# Patient Record
Sex: Male | Born: 2000 | Race: White | Hispanic: No | Marital: Single
Health system: Southern US, Community
[De-identification: ages and names within clinical notes are randomized; demographics above are authoritative.]

---
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
## 2018-03-25 ENCOUNTER — Emergency Department (HOSPITAL_COMMUNITY)
Admission: EM | Admit: 2018-03-25 | Discharge: 2018-03-26 | Disposition: A | Discharge status: Home / Self Care | Payer: Medicaid Other | Attending: Emergency Medicine | Admitting: Emergency Medicine

## 2018-03-25 ENCOUNTER — Emergency Department (HOSPITAL_COMMUNITY): Payer: Medicaid Other

## 2018-03-25 ENCOUNTER — Other Ambulatory Visit: Payer: Self-pay

## 2018-03-25 DIAGNOSIS — R110 Nausea: Secondary | ICD-10-CM | POA: Diagnosis not present

## 2018-03-25 DIAGNOSIS — R51 Headache: Secondary | ICD-10-CM | POA: Insufficient documentation

## 2018-03-25 DIAGNOSIS — R519 Headache, unspecified: Secondary | ICD-10-CM

## 2018-03-25 MED ORDER — DIPHENHYDRAMINE HCL 50 MG/ML IJ SOLN
25.0000 mg | Freq: Once | INTRAMUSCULAR | Status: AC
  Administered 2018-03-26: 25 mg via INTRAVENOUS
  Filled 2018-03-25: qty 1

## 2018-03-25 MED ORDER — DEXAMETHASONE SODIUM PHOSPHATE 10 MG/ML IJ SOLN
10.0000 mg | Freq: Once | INTRAMUSCULAR | Status: AC
  Administered 2018-03-26: 10 mg via INTRAVENOUS
  Filled 2018-03-25: qty 1

## 2018-03-25 MED ORDER — METOCLOPRAMIDE HCL 5 MG/ML IJ SOLN
10.0000 mg | Freq: Once | INTRAMUSCULAR | Status: AC
  Administered 2018-03-26: 10 mg via INTRAVENOUS
  Filled 2018-03-25: qty 2

## 2018-03-25 MED ORDER — SODIUM CHLORIDE 0.9 % IV BOLUS
1000.0000 mL | Freq: Once | INTRAVENOUS | Status: AC
  Administered 2018-03-26: 1000 mL via INTRAVENOUS

## 2018-03-25 NOTE — ED Provider Notes (Signed)
[facility] EMERGENCY DEPARTMENT Provider Note   CSN: [id number] Arrival date & time: 03/25/18  2237     History   Chief Complaint Chief Complaint  Patient presents with  . Headache    HPI Jimmy French is a 18 y.o. male.  The history is provided by the patient.  Headache  He has history of obesity, hyperglycemia and comes in complaining of a bifrontal headache for the last 3 days.  Headache is a pressure feeling and seems to be worse with eye movement and worse with standing.  He denies photophobia or phonophobia.  He denies fever, chills, sweats.  There is been no visual change.  He rates his pain at 9/10.  He states that he falls asleep following ibuprofen, but will wake up 45 minutes later with a headache still present.  There has been nausea but no vomiting.  He denies weakness, numbness, tingling.  He has not had headaches like this before.  No past medical history on file.  Patient Active Problem List   Diagnosis Date Noted  . OBESITY 09/11/2009  . VISUAL ACUITY, DECREASED, LEFT EYE 09/11/2009  . HYPERGLYCEMIA 03/14/2007    No past surgical history on file.      Home Medications    Prior to Admission medications   Medication Sig Start Date End Date Taking? Authorizing Provider  Dextromethorphan-Guaifenesin (ROBITUSSIN COLD COUGH+ CHEST PO) Take 30 mLs by mouth 2 (two) times daily. Only today    [provider]  ibuprofen (ADVIL,MOTRIN) 200 MG tablet Take 200 mg by mouth 3 (three) times daily as needed. For pain    [provider]  ibuprofen (ADVIL,MOTRIN) 600 MG tablet Take 1 tablet (600 mg total) by mouth every 6 (six) hours as needed. 02/04/13   Marcellina Millin, MD  ibuprofen (ADVIL,MOTRIN) 800 MG tablet Take 1 tablet (800 mg total) by mouth every 8 (eight) hours as needed. 09/14/16   Sherrilee Gilles, NP    Family History No family history on file.  Social History Social History   Tobacco Use  . Smoking status:  Passive Smoke Exposure - Never Smoker  . Smokeless tobacco: Never Used  Substance Use Topics  . Alcohol use: No  . Drug use: Never     Allergies   Patient has no known allergies.   Review of Systems Review of Systems  Neurological: Positive for headaches.  All other systems reviewed and are negative.    Physical Exam Updated Vital Signs BP 139/87 (BP Location: Left Arm)   Pulse 97   Temp 97.7 F (36.5 C) (Oral)   Resp 20   Ht 6\' 3"  (1.905 m)   Wt (!) 150 kg   SpO2 98%   BMI 41.33 kg/m   Physical Exam Vitals signs and nursing note reviewed.    Morbidly obese 18 year old male, resting comfortably and in no acute distress. Vital signs are normal. Oxygen saturation is 98%, which is normal. Head is normocephalic and atraumatic. PERRLA, EOMI. Oropharynx is clear.  Fundi show no hemorrhage, exudate, or papilledema.  There is no tenderness palpation over frontalis or temporalis muscles, but there is mild tenderness to palpation over the insertion of the paracervical muscles. Neck is nontender and supple without adenopathy or JVD. Back is nontender and there is no CVA tenderness. Lungs are clear without rales, wheezes, or rhonchi. Chest is nontender. Heart has regular rate and rhythm without murmur. Abdomen is soft, flat, nontender without masses or hepatosplenomegaly and peristalsis is  normoactive. Extremities have no cyanosis or edema, full range of motion is present. Skin is warm and dry without rash. Neurologic: Mental status is normal, cranial nerves are intact, there are no motor or sensory deficits.  ED Treatments / Results   Radiology Ct Head Wo Contrast  Result Date: 03/25/2018 CLINICAL DATA:  18 y/o  M; severe headache for 3 days. EXAM: CT HEAD WITHOUT CONTRAST TECHNIQUE: Contiguous axial images were obtained from the base of the skull through the vertex without intravenous contrast. COMPARISON:  None. FINDINGS: Brain: No evidence of acute infarction, hemorrhage,  hydrocephalus, extra-axial collection or mass lesion/mass effect. Vascular: No hyperdense vessel or unexpected calcification. Skull: Normal. Negative for fracture or focal lesion. Sinuses/Orbits: No acute finding. Other: None. IMPRESSION: Normal CT of the head. Electronically Signed   By: Mitzi Hansen M.D.   On: 03/25/2018 23:34    Procedures Procedures  Medications Ordered in ED Medications  sodium chloride 0.9 % bolus 1,000 mL (1,000 mLs Intravenous New Bag/Given 03/26/18 0015)  metoCLOPramide (REGLAN) injection 10 mg (10 mg Intravenous Given 03/26/18 0015)  diphenhydrAMINE (BENADRYL) injection 25 mg (25 mg Intravenous Given 03/26/18 0014)  dexamethasone (DECADRON) injection 10 mg (10 mg Intravenous Given 03/26/18 0014)     Initial Impression / Assessment and Plan / ED Course  I have reviewed the triage vital signs and the nursing notes.  Pertinent labs & imaging results that were available during my care of the patient were reviewed by me and considered in my medical decision making (see chart for details).  Headache of uncertain cause.  Benign physical exam.  Because he has not had similar headaches in the past, will send for CT of head.  We will also give migraine cocktail.  Old records are reviewed, and he has no relevant past visits.  CT of head was unremarkable.  He had significant improvement with above-noted treatment.  He noted mild headache was present when he stood up to go to the bathroom, but it was much improved over baseline.  He is discharged with instructions to use over-the-counter analgesics as needed for his headache, referred to neurology for further work-up if headaches persist.  Final Clinical Impressions(s) / ED Diagnoses   Final diagnoses:  Bad headache    ED Discharge Orders    None       Dione Booze, MD 03/26/18 0151

## 2018-03-25 NOTE — ED Triage Notes (Signed)
Patient c/o HA x2-3 days. Also c/o nausea and dizziness.

## 2018-03-26 NOTE — Discharge Instructions (Addendum)
Take ibuprofen and/or acetaminophen as needed for headache.  Return if symptoms are getting worse.

## 2019-05-03 IMAGING — CT CT HEAD W/O CM
4 series · 16 of 47 positions shown, 18 images · non-contrast
Comparison: None.

CLINICAL DATA: 18 y/o  M; severe headache for 3 days.

EXAM:
CT HEAD WITHOUT CONTRAST
TECHNIQUE: Contiguous axial images were obtained from the base of the skull
through the vertex without intravenous contrast.

[Series 3: head wo · axial · 0.46mm/px · z∈[-176,-51]mm · 7 of 35 slices shown, 9 images]
[im 5/35  brain]
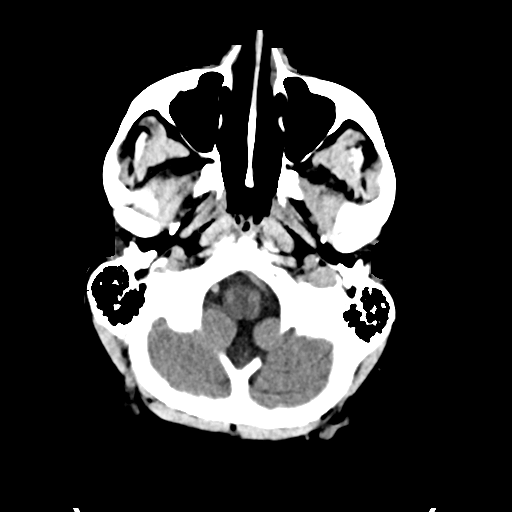
[im 5/35  bone]
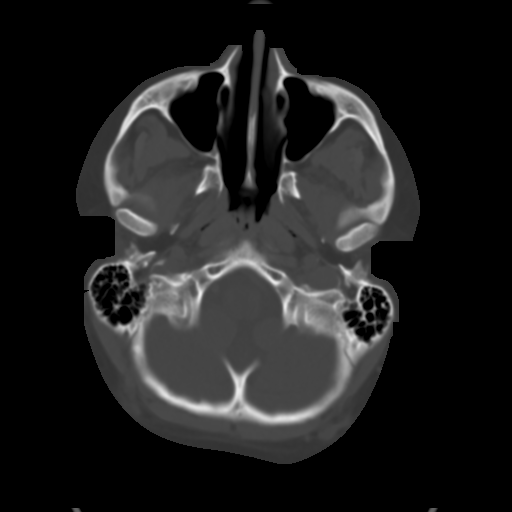
[im 9/35  brain]
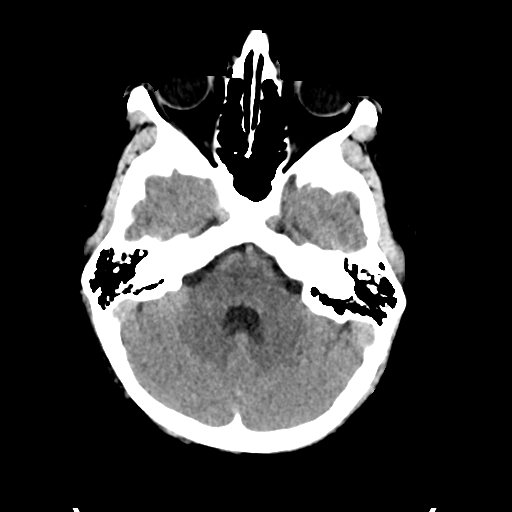
[im 13/35  brain]
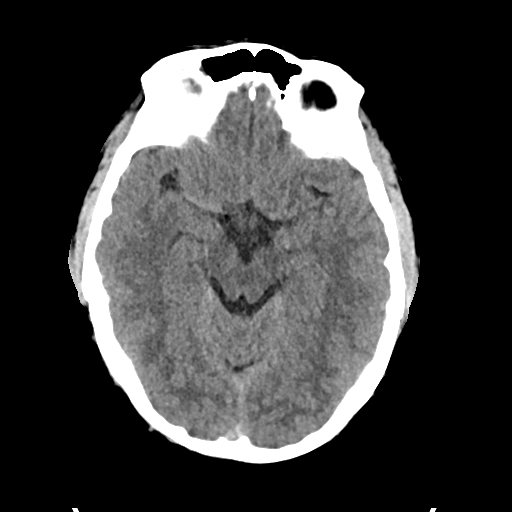
[im 18/35  brain]
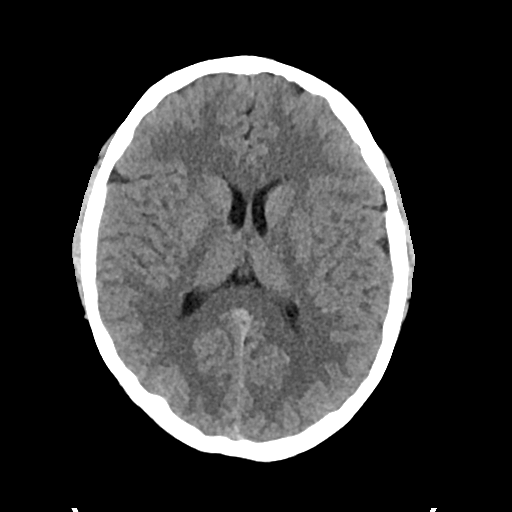
[im 22/35  brain]
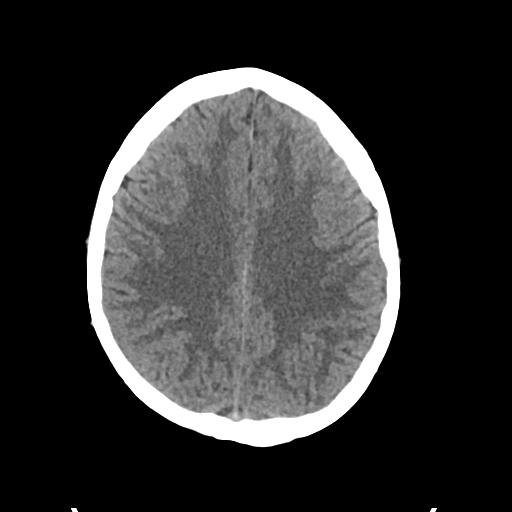
[im 22/35  bone]
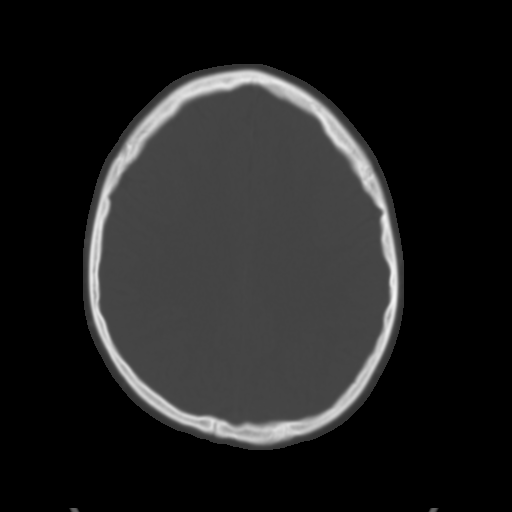
[im 26/35  brain]
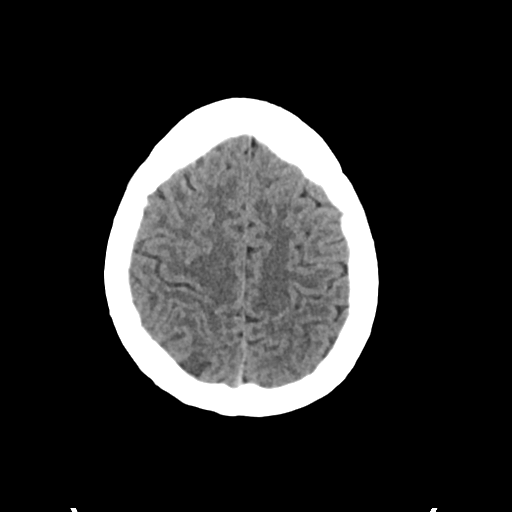
[im 30/35  brain]
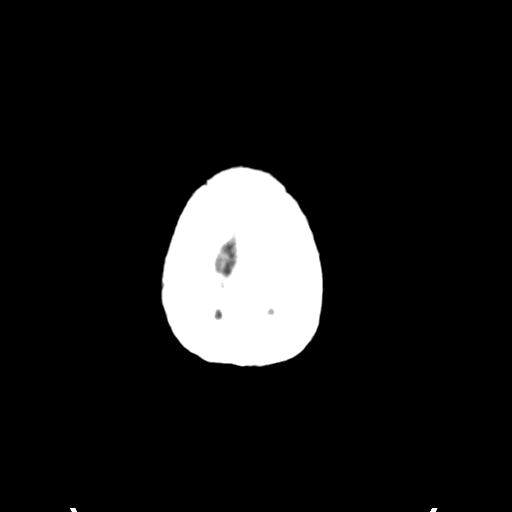

[Series 4: head bone · axial · 0.46mm/px · z∈[-180,-146]mm · 3 of 87 slices shown]
[im 9/87  bone]
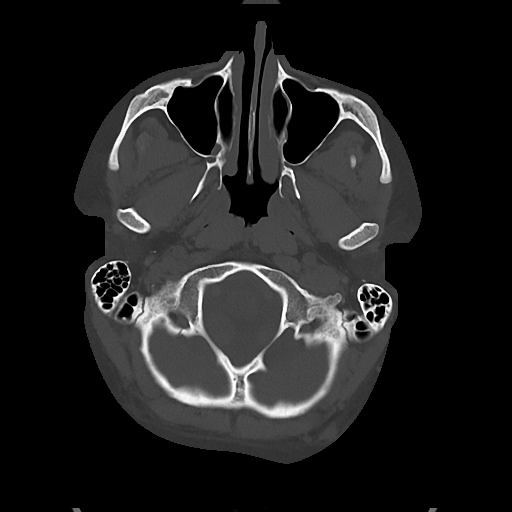
[im 18/87  bone]
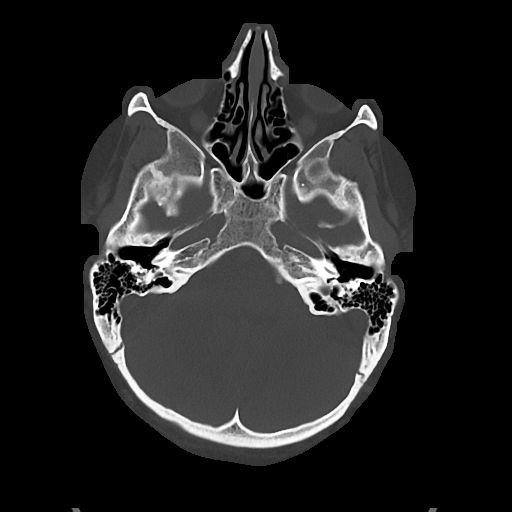
[im 26/87  bone]
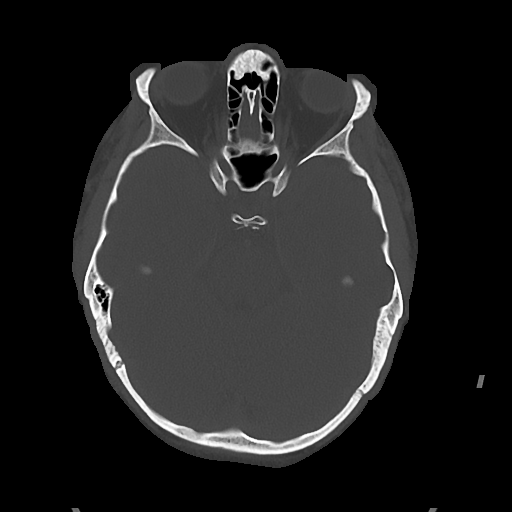

[Series 5: cor soft · coronal · 0.34mm/px · 3 of 75 slices shown]
[im 25/75  brain]
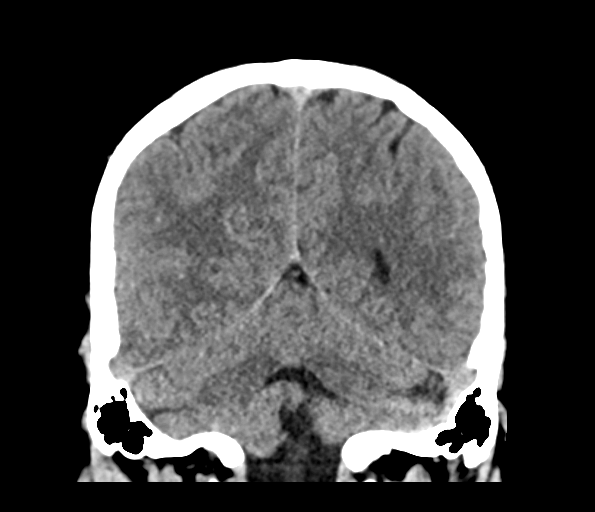
[im 33/75  brain]
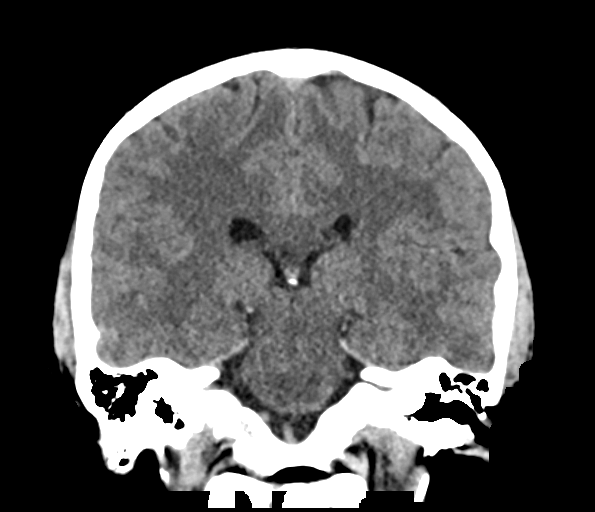
[im 42/75  brain]
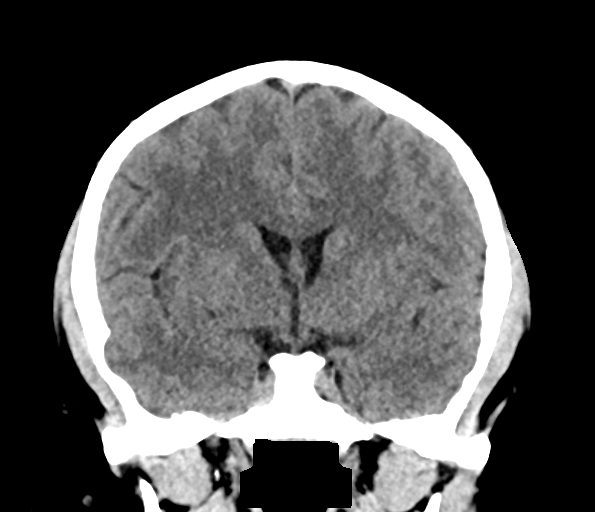

[Series 6: sag soft · sagittal · 0.34mm/px · 3 of 67 slices shown]
[im 23/67  brain]
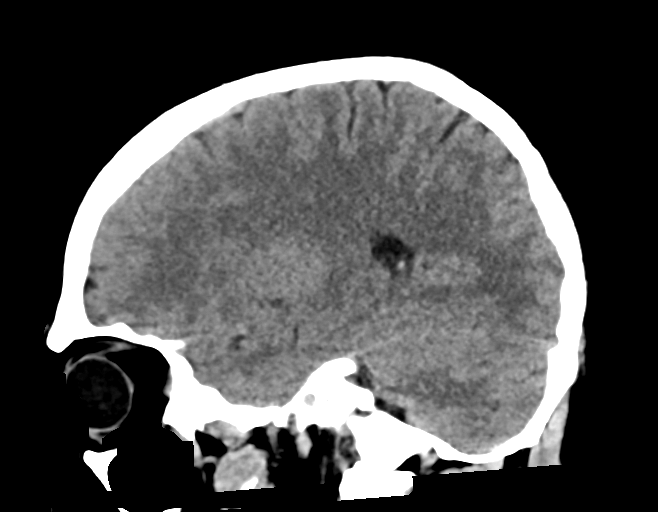
[im 34/67  brain]
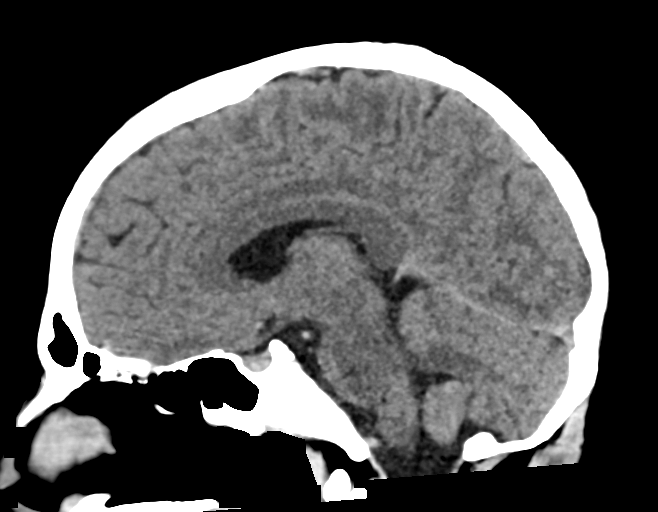
[im 45/67  brain]
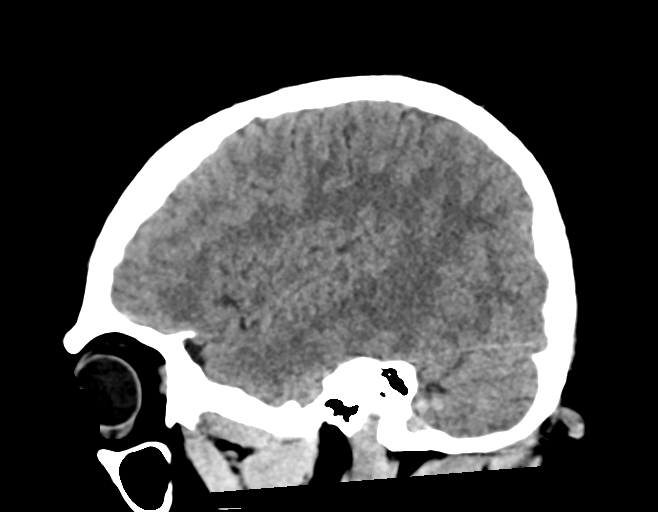

[16 of 47 positions shown; findings below may reference images not displayed]

FINDINGS: Brain: No evidence of acute infarction, hemorrhage, hydrocephalus,
extra-axial collection or mass lesion/mass effect.

Vascular: No hyperdense vessel or unexpected calcification.

Skull: Normal. Negative for fracture or focal lesion.

Sinuses/Orbits: No acute finding.

Other: None.
IMPRESSION: Normal CT of the head.

## 2019-12-16 ENCOUNTER — Ambulatory Visit (HOSPITAL_COMMUNITY)
Admission: EM | Admit: 2019-12-16 | Discharge: 2019-12-16 | Disposition: A | Discharge status: Home / Self Care | Payer: Medicaid Other | Attending: Family Medicine | Admitting: Family Medicine

## 2019-12-16 ENCOUNTER — Encounter (HOSPITAL_COMMUNITY): Payer: Self-pay

## 2019-12-16 ENCOUNTER — Other Ambulatory Visit: Payer: Self-pay

## 2019-12-16 DIAGNOSIS — K5900 Constipation, unspecified: Secondary | ICD-10-CM | POA: Diagnosis not present

## 2019-12-16 NOTE — Discharge Instructions (Signed)
Rx Miralax for constipation - start with 1/2 cap daily and may increase to 1 cap daily to achieve 1-2 soft stools per day.  Continue MIralax for ~6 months to allow the rectum to return to normal caliber.   Increase fruits, veggies, and water in diet.   Supportive cares, return precautions, and emergency procedures reviewed.  Please follow up if your symptoms fail to improve.   

## 2019-12-16 NOTE — ED Provider Notes (Signed)
[facility]    CSN: [id number] Arrival date & time: 12/16/19  1523      History   Chief Complaint Chief Complaint  Patient presents with  . Abdominal Pain    HPI Jimmy French is a 19 y.o. male.   He is presenting with a 6 to 26-month history of lower quadrant abdominal pain.  He feels the pain early in the morning and the subsides.  No history of surgery.  Stools alternate between normal and constipated.  Has normal voids.  Has tried MiraLAX for a minimal amount of time.  No fevers or chills.  HPI  History reviewed. No pertinent past medical history.  Patient Active Problem List   Diagnosis Date Noted  . OBESITY 09/11/2009  . VISUAL ACUITY, DECREASED, LEFT EYE 09/11/2009  . HYPERGLYCEMIA 03/14/2007    History reviewed. No pertinent surgical history.     Home Medications    Prior to Admission medications   Medication Sig Start Date End Date Taking? Authorizing Provider  ibuprofen (ADVIL,MOTRIN) 200 MG tablet Take 200 mg by mouth 3 (three) times daily as needed for headache.     [provider]    Family History History reviewed. No pertinent family history.  Social History Social History   Tobacco Use  . Smoking status: Passive Smoke Exposure - Never Smoker  . Smokeless tobacco: Never Used  Substance Use Topics  . Alcohol use: No  . Drug use: Never     Allergies   Patient has no known allergies.   Review of Systems Review of Systems  See HPI  Physical Exam Triage Vital Signs ED Triage Vitals  Enc Vitals Group     BP 12/16/19 1545 135/88     Pulse Rate 12/16/19 1545 67     Resp 12/16/19 1545 18     Temp 12/16/19 1545 98.5 F (36.9 C)     Temp Source 12/16/19 1545 Oral     SpO2 12/16/19 1545 100 %     Weight --      Height --      Head Circumference --      Peak Flow --      Pain Score 12/16/19 1544 0     Pain Loc --      Pain Edu? --      Excl. in GC? --    No data found.  Updated Vital Signs BP 135/88 (BP  Location: Right Arm)   Pulse 67   Temp 98.5 F (36.9 C) (Oral)   Resp 18   SpO2 100%   Visual Acuity Right Eye Distance:   Left Eye Distance:   Bilateral Distance:    Right Eye Near:   Left Eye Near:    Bilateral Near:     Physical Exam Gen: NAD, alert, cooperative with exam, well-appearing ENT: normal lips, normal nasal mucosa,  Eye: normal EOM, normal conjunctiva and lids CV: S1-S2 Resp: no accessory muscle use, non-labored,  GI: no masses or tenderness, no hernia, hypoactive bowel sounds Skin: no rashes, no areas of induration  Neuro: normal tone, normal sensation to touch Psych:  normal insight, alert and oriented    UC Treatments / Results  Labs (all labs ordered are listed, but only abnormal results are displayed) Labs Reviewed - No data to display  EKG   Radiology No results found.  Procedures Procedures (including critical care time)  Medications Ordered in UC Medications - No data to display  Initial Impression / Assessment and  Plan / UC Course  I have reviewed the triage vital signs and the nursing notes.  Pertinent labs & imaging results that were available during my care of the patient were reviewed by me and considered in my medical decision making (see chart for details).     Mr. Jimmy French is a 19 year old male that is presenting with intermittent lower quadrant abdominal pain.  Likely related to constipation.  May have a component IBS.  Counseled on MiraLAX.  Given indications on follow-up.  Final Clinical Impressions(s) / UC Diagnoses   Final diagnoses:  Constipation, unspecified constipation type     Discharge Instructions     Rx Miralax for constipation - start with 1/2 cap daily and may increase to 1 cap daily to achieve 1-2 soft stools per day.  Continue MIralax for ~6 months to allow the rectum to return to normal caliber.   Increase fruits, veggies, and water in diet.   Supportive cares, return precautions, and emergency procedures  reviewed.  Please follow up if your symptoms fail to improve.     ED Prescriptions    None     PDMP not reviewed this encounter.   Myra Rude, MD 12/16/19 [phone]

## 2019-12-16 NOTE — ED Triage Notes (Signed)
Pt present abdominal pain in the medial lower area. Symptom starts every morning. Pt is having trouble with constipation also and when he wipes his bottom he states he wipes mucous clear specimen from that area

## 2020-04-30 ENCOUNTER — Emergency Department (HOSPITAL_COMMUNITY)
Admission: EM | Admit: 2020-04-30 | Discharge: 2020-04-30 | Disposition: A | Discharge status: Home / Self Care | Payer: Medicaid Other | Attending: Emergency Medicine | Admitting: Emergency Medicine

## 2020-04-30 ENCOUNTER — Encounter (HOSPITAL_COMMUNITY): Payer: Self-pay | Admitting: Emergency Medicine

## 2020-04-30 DIAGNOSIS — K5900 Constipation, unspecified: Secondary | ICD-10-CM | POA: Insufficient documentation

## 2020-04-30 DIAGNOSIS — Z7722 Contact with and (suspected) exposure to environmental tobacco smoke (acute) (chronic): Secondary | ICD-10-CM | POA: Diagnosis not present

## 2020-04-30 DIAGNOSIS — R1084 Generalized abdominal pain: Secondary | ICD-10-CM | POA: Diagnosis present

## 2020-04-30 LAB — COMPREHENSIVE METABOLIC PANEL
ALT: 43 U/L (ref 0–44)
AST: 23 U/L (ref 15–41)
Albumin: 4.1 g/dL (ref 3.5–5.0)
Alkaline Phosphatase: 62 U/L (ref 38–126)
Anion gap: 7 (ref 5–15)
BUN: 15 mg/dL (ref 6–20)
CO2: 26 mmol/L (ref 22–32)
Calcium: 9 mg/dL (ref 8.9–10.3)
Chloride: 106 mmol/L (ref 98–111)
Creatinine, Ser: 1.02 mg/dL (ref 0.61–1.24)
GFR, Estimated: >60 mL/min (ref >60)
Glucose, Bld: 116 mg/dL — ABNORMAL HIGH (ref 70–99)
Potassium: 3.8 mmol/L (ref 3.5–5.1)
Sodium: 139 mmol/L (ref 135–145)
Total Bilirubin: 1 mg/dL (ref 0.3–1.2)
Total Protein: 6.9 g/dL (ref 6.5–8.1)

## 2020-04-30 LAB — URINALYSIS, ROUTINE W REFLEX MICROSCOPIC
Bacteria, UA: NONE SEEN
Bilirubin Urine: NEGATIVE
Glucose, UA: NEGATIVE mg/dL
Ketones, ur: NEGATIVE mg/dL
Leukocytes,Ua: NEGATIVE
Nitrite: NEGATIVE
Protein, ur: NEGATIVE mg/dL
Specific Gravity, Urine: 1.023 (ref 1.005–1.030)
pH: 5 (ref 5.0–8.0)

## 2020-04-30 LAB — CBC
HCT: 48.2 % (ref 39.0–52.0)
Hemoglobin: 15.6 g/dL (ref 13.0–17.0)
MCH: 29.3 pg (ref 26.0–34.0)
MCHC: 32.4 g/dL (ref 30.0–36.0)
MCV: 90.6 fL (ref 80.0–100.0)
Platelets: 287 10*3/uL (ref 150–400)
RBC: 5.32 MIL/uL (ref 4.22–5.81)
RDW: 12.9 % (ref 11.5–15.5)
WBC: 9.2 10*3/uL (ref 4.0–10.5)
nRBC: 0 % (ref 0.0–0.2)

## 2020-04-30 LAB — LIPASE, BLOOD: Lipase: 36 U/L (ref 11–51)

## 2020-04-30 NOTE — ED Provider Notes (Signed)
[facility] EMERGENCY DEPARTMENT Provider Note   CSN: [id number] Arrival date & time: 04/30/20  1609     History Chief Complaint  Patient presents with  . Abdominal Pain    Jimmy French is a 20 y.o. male.  HPI Patient is a 20 year old male who is morbidly obese with history of hyperglycemia presenting today with several months of generalized abdominal pain.  He states it seems to come and go questionably worse with eating.  He states that he has been forcing himself to poop by straining.  He states that as a child he had issues with constipation and as a baby require digital stimulation to remove poop from his anus.  He denies any fevers, chills, lightheadedness or dizziness.  He denies any sniffing and changes in his symptoms.  He came to the ER today for evaluation and recommendations.  He has been using the Linzess that was prescribed to his grandmother however he states that it made him have some crampy abdominal pain.  No nausea or vomiting.  He states he is still passing flatus without difficulty.  No abdominal surgeries in his history.     History reviewed. No pertinent past medical history.  Patient Active Problem List   Diagnosis Date Noted  . OBESITY 09/11/2009  . VISUAL ACUITY, DECREASED, LEFT EYE 09/11/2009  . HYPERGLYCEMIA 03/14/2007    History reviewed. No pertinent surgical history.     No family history on file.  Social History   Tobacco Use  . Smoking status: Passive Smoke Exposure - Never Smoker  . Smokeless tobacco: Never Used  Substance Use Topics  . Alcohol use: No  . Drug use: Never    Home Medications Prior to Admission medications   Medication Sig Start Date End Date Taking? Authorizing Provider  ibuprofen (ADVIL,MOTRIN) 200 MG tablet Take 200 mg by mouth 3 (three) times daily as needed for headache.     [provider]    Allergies    Patient has no known allergies.  Review of Systems   Review of Systems   Constitutional: Negative for chills and fever.  HENT: Negative for congestion.   Eyes: Negative for pain.  Respiratory: Negative for cough and shortness of breath.   Cardiovascular: Negative for chest pain and leg swelling.  Gastrointestinal: Positive for abdominal pain and constipation. Negative for nausea and vomiting.  Genitourinary: Negative for dysuria.  Musculoskeletal: Negative for myalgias and neck pain.  Skin: Negative for rash.  Neurological: Negative for dizziness and headaches.    Physical Exam Updated Vital Signs BP 110/63   Pulse 69   Temp 97.9 F (36.6 C)   Resp 13   SpO2 98%   Physical Exam Vitals and nursing note reviewed.  Constitutional:      General: He is not in acute distress.    Comments: Pleasant well-appearing 20 year old.  In no acute distress.  Sitting comfortably in bed.  Able answer questions appropriately follow commands. No increased work of breathing. Speaking in full sentences.  HENT:     Head: Normocephalic and atraumatic.     Nose: Nose normal.  Eyes:     General: No scleral icterus. Cardiovascular:     Rate and Rhythm: Normal rate and regular rhythm.     Pulses: Normal pulses.     Heart sounds: Normal heart sounds.  Pulmonary:     Effort: Pulmonary effort is normal. No respiratory distress.     Breath sounds: No wheezing.  Abdominal:  Palpations: Abdomen is soft.     Tenderness: There is no abdominal tenderness.     Comments: Abd is obese, no TTP, soft no guarding or rebound BS+  Genitourinary:    Comments: Testes normal Musculoskeletal:     Cervical back: Normal range of motion.     Right lower leg: No edema.     Left lower leg: No edema.  Skin:    General: Skin is warm and dry.     Capillary Refill: Capillary refill takes less than 2 seconds.  Neurological:     Mental Status: He is alert. Mental status is at baseline.  Psychiatric:        Mood and Affect: Mood normal.        Behavior: Behavior normal.     ED  Results / Procedures / Treatments   Labs (all labs ordered are listed, but only abnormal results are displayed) Labs Reviewed  COMPREHENSIVE METABOLIC PANEL - Abnormal; Notable for the following components:      Result Value   Glucose, Bld 116 (*)    All other components within normal limits  URINALYSIS, ROUTINE W REFLEX MICROSCOPIC - Abnormal; Notable for the following components:   Hgb urine dipstick SMALL (*)    All other components within normal limits  LIPASE, BLOOD  CBC    EKG None  Radiology No results found.  Procedures Procedures   Medications Ordered in ED Medications - No data to display  ED Course  I have reviewed the triage vital signs and the nursing notes.  Pertinent labs & imaging results that were available during my care of the patient were reviewed by me and considered in my medical decision making (see chart for details).    MDM Rules/Calculators/A&P                          Patient is 20 year old male with history of chronic constipation presented today with abdominal pain which is intermittent not present currently and associated with constipation.  He states that he did have some abdominal pain when he used Linzess which is the medication prescribed to his grandmother.  This is a stimulant laxative.  He states that he does not drink any water and only drinks soda.  My physical exam is unremarkable patient has reassuring physical exam of the abdomen and testes.  No hernia palpated.  Counseled on stool softeners and need for hydration.  I personally reviewed all laboratory work and imaging.  Metabolic panel without any acute abnormality specifically kidney function within normal limits and no significant electrolyte abnormalities. CBC without leukocytosis or significant anemia.  Lipase within normal pancreatitis.  Urinalysis unremarkable apart from small hemoglobin.  I discussed the results with him.  Specifically he will need to follow-up with  his primary care doctor to review his urine and potentially retest and discussed importance of weight loss, exercise and proper fluid intake.  He has been using MiraLAX but has not been drinking any water and only drinks soda.  I suspect his symptoms are related to constipation this is a chronic issue.  Is been going on for 7 months.  He will follow-up with PCP and return to ER if any new or concerning symptoms.  Final Clinical Impression(s) / ED Diagnoses Final diagnoses:  Constipation, unspecified constipation type  Generalized abdominal pain    Rx / DC Orders ED Discharge Orders    None       Master Touchet, Rodrigo Ran, [place]  04/30/20 2027    Charlynne Pander, MD 05/02/20 [phone]

## 2020-04-30 NOTE — ED Triage Notes (Signed)
Patient complains of mucous in stool and constipation that started seven months ago. Patient alert, oriented, and in no apparent distress at this time.

## 2020-04-30 NOTE — Discharge Instructions (Signed)
Cleanout:  Miralax cleanout 5 capfuls in 24-36 oz gatorade, drink over 4-6 hrs. If stool is not clear after 24 hours, then repeat this dose for a second day.   After Cleanout:  Give Miralax 1 capful in 8 oz juice or gatorade once-three times daily for at least 4-6 weeks.  Schedule follow up with pediatrician if no improvement in constipation in 1-2 months, sooner if not resolved after cleanout.    GETTING TO GOOD BOWEL HEALTH. Irregular bowel habits such as constipation and diarrhea can lead to many problems over time.  Having one soft bowel movement a day is the most important way to prevent further problems.  The anorectal canal is designed to handle stretching and feces to safely manage our ability to get rid of solid waste (feces, poop, stool) out of our body.  BUT, hard constipated stools can act like ripping concrete bricks and diarrhea can be a burning fire to this very sensitive area of our body, causing inflamed hemorrhoids, anal fissures, increasing risk is perirectal abscesses, abdominal pain/bloating, an making irritable bowel worse.     The goal: ONE SOFT BOWEL MOVEMENT A DAY!  To have soft, regular bowel movements:  Drink at least 8 tall glasses of water a day.   Take plenty of fiber.  Fiber is the undigested part of plant food that passes into the colon, acting s "natures broom" to encourage bowel motility and movement.  Fiber can absorb and hold large amounts of water. This results in a larger, bulkier stool, which is soft and easier to pass. Work gradually over several weeks up to 6 servings a day of fiber (25g a day even more if needed) in the form of: Vegetables -- Root (potatoes, carrots, turnips), leafy green (lettuce, salad greens, celery, spinach), or cooked high residue (cabbage, broccoli, etc) Fruit -- Fresh (unpeeled skin & pulp), Dried (prunes, apricots, cherries, etc ),  or stewed ( applesauce)  Whole grain breads, pasta, etc (whole wheat)  Bran cereals  Bulking Agents  -- This type of water-retaining fiber generally is easily obtained each day by one of the following:  Psyllium bran -- The psyllium plant is remarkable because its ground seeds can retain so much water. This product is available as Metamucil, Konsyl, Effersyllium, Per Diem Fiber, or the less expensive generic preparation in drug and health food stores. Although labeled a laxative, it really is not a laxative.  Methylcellulose -- This is another fiber derived from wood which also retains water. It is available as Citrucel. Polyethylene Glycol - and "artificial" fiber commonly called Miralax or Glycolax.  It is helpful for people with gassy or bloated feelings with regular fiber Flax Seed - a less gassy fiber than psyllium No reading or other relaxing activity while on the toilet. If bowel movements take longer than 5 minutes, you are too constipated AVOID CONSTIPATION.  High fiber and water intake usually takes care of this.  Sometimes a laxative is needed to stimulate more frequent bowel movements, but  Laxatives are not a good long-term solution as it can wear the colon out. Osmotics (Milk of Magnesia, Fleets phosphosoda, Magnesium citrate, MiraLax, GoLytely) are safer than  Stimulants (Senokot, Castor Oil, Dulcolax, Ex Lax)    Do not take laxatives for more than 7days in a row.  IF SEVERELY CONSTIPATED, try a Bowel Retraining Program: Do not use laxatives.  Eat a diet high in roughage, such as bran cereals and leafy vegetables.  Drink six (6) ounces of prune or  apricot juice each morning.  Eat two (2) large servings of stewed fruit each day.  Take one (1) heaping tablespoon of a psyllium-based bulking agent twice a day. Use sugar-free sweetener when possible to avoid excessive calories.  Eat a normal breakfast.  Set aside 15 minutes after breakfast to sit on the toilet, but do not strain to have a bowel movement.  If you do not have a bowel movement by the third day, use an enema and repeat the  above steps.  Controlling diarrhea Switch to liquids and simpler foods for a few days to avoid stressing your intestines further. Avoid dairy products (especially milk & ice cream) for a short time.  The intestines often can lose the ability to digest lactose when stressed. Avoid foods that cause gassiness or bloating.  Typical foods include beans and other legumes, cabbage, broccoli, and dairy foods.  Every person has some sensitivity to other foods, so listen to our body and avoid those foods that trigger problems for you. Adding fiber (Citrucel, Metamucil, psyllium, Miralax) gradually can help thicken stools by absorbing excess fluid and retrain the intestines to act more normally.  Slowly increase the dose over a few weeks.  Too much fiber too soon can backfire and cause cramping & bloating. Probiotics (such as active yogurt, Align, etc) may help repopulate the intestines and colon with normal bacteria and calm down a sensitive digestive tract.  Most studies show it to be of mild help, though, and such products can be costly. Medicines: Bismuth subsalicylate (ex. Kayopectate, Pepto Bismol) every 30 minutes for up to 6 doses can help control diarrhea.  Avoid if pregnant. Loperamide (Immodium) can slow down diarrhea.  Start with two tablets (4mg  total) first and then try one tablet every 6 hours.  Avoid if you are having fevers or severe pain.  If you are not better or start feeling worse, stop all medicines and call your doctor for advice Call your doctor if you are getting worse or not better.  Sometimes further testing (cultures, endoscopy, X-ray studies, bloodwork, etc) may be needed to help diagnose and treat the cause of the diarrhea.  Managing Pain  Pain after surgery or related to activity is often due to strain/injury to muscle, tendon, nerves and/or incisions.  This pain is usually short-term and will improve in a few months.   Many people find it helpful to do the following things  TOGETHER to help speed the process of healing and to get back to regular activity more quickly:  Avoid heavy physical activity  no lifting greater than 20 pounds Do not "push through" the pain.  Listen to your body and avoid positions and maneuvers than reproduce the pain Walking is okay as tolerated, but go slowly and stop when getting sore.  Remember: If it hurts to do it, then don't do it! Take Anti-inflammatory medication  Take with food/snack around the clock for 1-2 weeks This helps the muscle and nerve tissues become less irritable and calm down faster Choose ONE of the following over-the-counter medications: Naproxen 220mg  tabs (ex. Aleve) 1-2 pills twice a day  Ibuprofen 200mg  tabs (ex. Advil, Motrin) 3-4 pills with every meal and just before bedtime Acetaminophen 500mg  tabs (Tylenol) 1-2 pills with every meal and just before bedtime Use a Heating pad or Ice/Cold Pack 4-6 times a day May use warm bath/hottub  or showers Try Gentle Massage and/or Stretching  at the area of pain many times a day stop if you feel pain -  do not overdo it  Try these steps together to help you body heal faster and avoid making things get worse.  Doing just one of these things may not be enough.    If you are not getting better after two weeks or are noticing you are getting worse, contact our office for further advice; we may need to re-evaluate you & see what other things we can do to help.
